# Patient Record
Sex: Male | Born: 1960
Health system: Southern US, Community
[De-identification: ages and names within clinical notes are randomized; demographics above are authoritative.]

## PROBLEM LIST (undated history)

## (undated) DIAGNOSIS — E119 Type 2 diabetes mellitus without complications: Secondary | ICD-10-CM

## (undated) DIAGNOSIS — I10 Essential (primary) hypertension: Secondary | ICD-10-CM

## (undated) DIAGNOSIS — S62619A Displaced fracture of proximal phalanx of unspecified finger, initial encounter for closed fracture: Secondary | ICD-10-CM

## (undated) HISTORY — PX: NO PAST SURGERIES: SHX2092

---

## 2006-09-13 ENCOUNTER — Emergency Department (HOSPITAL_COMMUNITY): Admission: EM | Admit: 2006-09-13 | Discharge: 2006-09-13 | Payer: Self-pay | Admitting: Emergency Medicine

## 2006-11-05 ENCOUNTER — Emergency Department (HOSPITAL_COMMUNITY): Admission: EM | Admit: 2006-11-05 | Discharge: 2006-11-05 | Payer: Self-pay | Admitting: Emergency Medicine

## 2007-03-22 ENCOUNTER — Ambulatory Visit (HOSPITAL_COMMUNITY): Admission: RE | Admit: 2007-03-22 | Discharge: 2007-03-22 | Payer: Self-pay | Admitting: General Surgery

## 2008-06-20 ENCOUNTER — Ambulatory Visit (HOSPITAL_COMMUNITY): Admission: RE | Admit: 2008-06-20 | Discharge: 2008-06-20 | Payer: Self-pay | Admitting: Preventative Medicine

## 2010-11-17 ENCOUNTER — Ambulatory Visit (HOSPITAL_COMMUNITY)
Admission: RE | Admit: 2010-11-17 | Discharge: 2010-11-17 | Payer: Self-pay | Source: Home / Self Care | Attending: Family Medicine | Admitting: Family Medicine

## 2011-03-27 NOTE — H&P (Signed)
Kristopher Fox, Kristopher Fox            ACCOUNT NO.:  [id number]   MEDICAL RECORD NO.:  [id number]          PATIENT TYPE:  AMB   LOCATION:  DAY                           FACILITY:  [facility]   PHYSICIAN:  Dalia Heading, M.D.  DATE OF BIRTH:  [birth year]   DATE OF ADMISSION:  DATE OF DISCHARGE:  LH                              HISTORY & PHYSICAL   CHIEF COMPLAINT:  Family history of colon carcinoma.   HISTORY OF PRESENT ILLNESS:  The patient is a 50 year old, black male  who is referred for endoscopic evaluation.  He needs a colonoscopy due  to a family history of colon carcinoma in his brother and father.  No  abdominal pain, weight loss, nausea, vomiting, diarrhea, constipation,  melena, or hematochezia have been noted.  He has never had a  colonoscopy.   PAST MEDICAL HISTORY:  Hypertension.   PAST SURGICAL HISTORY:  Unremarkable.   CURRENT MEDICATIONS:  Fluid pill.   ALLERGIES:  No known drug allergies.   REVIEW OF SYSTEMS:  Noncontributory.   PHYSICAL EXAMINATION:  GENERAL:  The patient is a well-developed, well-  nourished, black male in no acute distress.  LUNGS:  Clear to auscultation with equal breath sounds bilaterally.  HEART:  Regular rate and rhythm without S3, S4, murmurs.  ABDOMEN:  Soft, nontender, nondistended.  No hepatosplenomegaly or  masses are noted.  RECTAL:  Deferred to the procedure.   IMPRESSION:  Family history of colon carcinoma.   PLAN:  The patient is scheduled for a colonoscopy on February 23, 2007.  The risks and benefits of the procedure including bleeding and  perforation were fully explained to the patient, and gave informed  consent.      Dalia Heading, M.D.  Electronically Signed     MAJ/MEDQ  D:  02/10/2007  T:  02/10/2007  Job:  [id number]   cc:   Patrica Duel, M.D.  Fax: [phone]

## 2013-12-19 ENCOUNTER — Ambulatory Visit (HOSPITAL_COMMUNITY)
Admission: RE | Admit: 2013-12-19 | Discharge: 2013-12-19 | Disposition: A | Discharge status: Home / Self Care | Payer: PRIVATE HEALTH INSURANCE | Source: Ambulatory Visit | Attending: Internal Medicine | Admitting: Internal Medicine

## 2013-12-19 ENCOUNTER — Other Ambulatory Visit (HOSPITAL_COMMUNITY): Payer: Self-pay | Admitting: Internal Medicine

## 2013-12-19 DIAGNOSIS — M259 Joint disorder, unspecified: Secondary | ICD-10-CM | POA: Insufficient documentation

## 2013-12-19 DIAGNOSIS — M2390 Unspecified internal derangement of unspecified knee: Secondary | ICD-10-CM

## 2013-12-19 DIAGNOSIS — M25569 Pain in unspecified knee: Secondary | ICD-10-CM | POA: Insufficient documentation

## 2013-12-19 DIAGNOSIS — M25469 Effusion, unspecified knee: Secondary | ICD-10-CM | POA: Insufficient documentation

## 2014-01-02 ENCOUNTER — Ambulatory Visit: Payer: PRIVATE HEALTH INSURANCE | Admitting: Orthopedic Surgery

## 2014-01-15 ENCOUNTER — Ambulatory Visit (INDEPENDENT_AMBULATORY_CARE_PROVIDER_SITE_OTHER): Payer: PRIVATE HEALTH INSURANCE | Admitting: Orthopedic Surgery

## 2014-01-15 VITALS — BP 152/93 | Ht 68.0 in | Wt 250.0 lb

## 2014-01-15 DIAGNOSIS — M25469 Effusion, unspecified knee: Secondary | ICD-10-CM

## 2014-01-15 MED ORDER — NABUMETONE 500 MG PO TABS: 500.0000 mg | ORAL_TABLET | Freq: Two times a day (BID) | ORAL | Status: DC

## 2014-01-15 NOTE — Progress Notes (Signed)
Patient ID: Kristopher LessGregory S Fox, male   DOB: 1961/07/05, 53 y.o.   MRN: [id number] Chief Complaint  Patient presents with  . Knee Pain    Right knee pain and instability. Referred by Dr. Sherwood GamblerFusco    53 year old male with intermittent symptoms of pain and swelling in his right knee which seemed to have responded well to an intramuscular injection of cortisone. He presents with a three-week history of sudden onset of pain in the front of the knee which is exacerbated by walking. Occasional catching sensation with swelling. Currently asymptomatic. Review of systems negative. History of hypertension of surgery currently on losartan and hydrochlorothiazide combined pill  Family history of cancer diabetes  Social history married  Employment. Educational level XII. No smoking or drinking Vital signs BP 152/93  Ht 5\' 8"  (1.727 m)  Wt 250 lb (113.399 kg)  BMI 38.02 kg/m2  General appearance: Development, nutrition are normal. Body habitus moderately large No gross deformities are noted and grooming normal.  Peripheral vascular system no swelling or varicose veins are noted and pulses are palpable without tenderness, temperature warm to touch no edema.  No palpable lymph nodes are noted in the cervical area or axillae.  Gait abnormalities none detected Balance is normal  Neurologic exam shows no pathologic reflexes and overall normal sensation  Right knee evaluation Inspection reveals medium-sized joint effusion. Slight decrease in range of motion. Ligaments are stable. Motor exam quadricep strength grade 5. Skin warm dry and intact. Provocative meniscal tests negative. No joint line tenderness.   Left knee evaluation Inspection reveals no swelling or tenderness. Full range of motion is noted. The knee is stable and the anterior posterior plane as well as coronal plane. Motor exam reveals 5 out of 5 strength in the quadriceps and hand strength. The skin is warm dry and intact  Provocative  meniscal tests are normal  Radiographs show no acute abnormality  Impression probable acute synovitis with effusion  Patient currently asymptomatic responded well to IM shot recommend oral anti-inflammatories one to 2 weeks when knee is symptomatic and if symptoms fail to improve call the office to get a new appointment for reevaluation.

## 2014-01-15 NOTE — Patient Instructions (Signed)
Take medication IF knee hurts

## 2018-03-16 ENCOUNTER — Emergency Department (HOSPITAL_COMMUNITY): Payer: BLUE CROSS/BLUE SHIELD

## 2018-03-16 ENCOUNTER — Encounter (HOSPITAL_COMMUNITY): Payer: Self-pay | Admitting: Emergency Medicine

## 2018-03-16 ENCOUNTER — Other Ambulatory Visit: Payer: Self-pay

## 2018-03-16 ENCOUNTER — Emergency Department (HOSPITAL_COMMUNITY)
Admission: EM | Admit: 2018-03-16 | Discharge: 2018-03-16 | Disposition: A | Discharge status: Home / Self Care | Payer: BLUE CROSS/BLUE SHIELD | Attending: Emergency Medicine | Admitting: Emergency Medicine

## 2018-03-16 DIAGNOSIS — Y939 Activity, unspecified: Secondary | ICD-10-CM | POA: Insufficient documentation

## 2018-03-16 DIAGNOSIS — E119 Type 2 diabetes mellitus without complications: Secondary | ICD-10-CM | POA: Insufficient documentation

## 2018-03-16 DIAGNOSIS — X58XXXA Exposure to other specified factors, initial encounter: Secondary | ICD-10-CM | POA: Diagnosis not present

## 2018-03-16 DIAGNOSIS — Y929 Unspecified place or not applicable: Secondary | ICD-10-CM | POA: Insufficient documentation

## 2018-03-16 DIAGNOSIS — Y999 Unspecified external cause status: Secondary | ICD-10-CM | POA: Diagnosis not present

## 2018-03-16 DIAGNOSIS — S39012A Strain of muscle, fascia and tendon of lower back, initial encounter: Secondary | ICD-10-CM

## 2018-03-16 DIAGNOSIS — S3992XA Unspecified injury of lower back, initial encounter: Secondary | ICD-10-CM | POA: Diagnosis present

## 2018-03-16 DIAGNOSIS — I10 Essential (primary) hypertension: Secondary | ICD-10-CM | POA: Diagnosis not present

## 2018-03-16 HISTORY — DX: Essential (primary) hypertension: I10

## 2018-03-16 HISTORY — DX: Type 2 diabetes mellitus without complications: E11.9

## 2018-03-16 LAB — URINALYSIS, ROUTINE W REFLEX MICROSCOPIC
Bilirubin Urine: NEGATIVE
Glucose, UA: NEGATIVE mg/dL
Hgb urine dipstick: NEGATIVE
KETONES UR: NEGATIVE mg/dL
LEUKOCYTES UA: NEGATIVE
NITRITE: NEGATIVE
PH: 5 (ref 5.0–8.0)
Protein, ur: NEGATIVE mg/dL
SPECIFIC GRAVITY, URINE: 1.026 (ref 1.005–1.030)

## 2018-03-16 MED ORDER — METHOCARBAMOL 500 MG PO TABS
1000.0000 mg | ORAL_TABLET | Freq: Once | ORAL | Status: AC
  Administered 2018-03-16: 1000 mg via ORAL
  Filled 2018-03-16: qty 2

## 2018-03-16 MED ORDER — IBUPROFEN 800 MG PO TABS: 800.0000 mg | ORAL_TABLET | Freq: Three times a day (TID) | ORAL | 0 refills | Status: AC

## 2018-03-16 MED ORDER — IBUPROFEN 800 MG PO TABS
800.0000 mg | ORAL_TABLET | Freq: Once | ORAL | Status: AC
  Administered 2018-03-16: 800 mg via ORAL
  Filled 2018-03-16: qty 1

## 2018-03-16 MED ORDER — METHOCARBAMOL 500 MG PO TABS: 1000.0000 mg | ORAL_TABLET | Freq: Four times a day (QID) | ORAL | 0 refills | Status: AC

## 2018-03-16 MED ORDER — HYDROCODONE-ACETAMINOPHEN 5-325 MG PO TABS
1.0000 | ORAL_TABLET | Freq: Once | ORAL | Status: AC
  Administered 2018-03-16: 1 via ORAL
  Filled 2018-03-16: qty 1

## 2018-03-16 MED ORDER — HYDROCODONE-ACETAMINOPHEN 5-325 MG PO TABS: 1.0000 | ORAL_TABLET | ORAL | 0 refills | Status: DC | PRN

## 2018-03-16 NOTE — ED Triage Notes (Signed)
Patient c/o non-radiating, right lower back pain. Per patient no known injury, but lifting at work. Patient reports taking BC powder and Back Max pill with no relief. CNS intact, patient ambulatory. Denies any complications with BMs or urination/urinary symptoms.

## 2018-03-16 NOTE — Discharge Instructions (Addendum)
T Do not drive within 4 hours of taking hydrocodone or robaxin as this will make you drowsy.  Avoid lifting,  Bending,  Twisting or any other activity that worsens your pain over the next week.  Apply a heating pad to your lower back for 20 minutes several times daily.   You should get rechecked if your symptoms are not better over the next 5 days,  Or you develop increased pain,  Weakness in your leg(s) or loss of bladder or bowel function - these are symptoms of a worsening condition.

## 2018-03-16 NOTE — ED Notes (Signed)
Pt states he has had previous kidney stones. When asked if this feels the same patient doesn't give an accurate answer. States "it just hurts real bad." Lifting at work and at home when pain started.

## 2018-03-16 NOTE — ED Provider Notes (Signed)
[facility] EMERGENCY DEPARTMENT Provider Note   CSN: [id number] Arrival date & time: 03/16/18  2130     History   Chief Complaint Chief Complaint  Patient presents with  . Back Pain    HPI Kristopher Fox is a 57 y.o. male with a history of occasional low back pain with development of right lower back pain for the past week, not relieved by otc medications including BC owders and a medication called Back max.   Patient denies any new injury specifically but has been lifting heavy boxes at work.  There is not  radiation of pain into his legs and he has been no weakness or numbness in the lower extremities and no urinary or bowel retention or incontinence.  Patient does not have a history of cancer or IVDU.  He reports a distant hx of kidney stones but denies flank pain or urinary sx and sx are triggered by movement.  The history is provided by the patient.    Past Medical History:  Diagnosis Date  . Diabetes mellitus without complication (HCC)   . Hypertension     Patient Active Problem List   Diagnosis Date Noted  . Effusion of knee joint 01/15/2014    History reviewed. No pertinent surgical history.      Home Medications    Prior to Admission medications   Medication Sig Start Date End Date Taking? Authorizing Provider  HYDROcodone-acetaminophen (NORCO/VICODIN) 5-325 MG tablet Take 1 tablet by mouth every 4 (four) hours as needed. 03/16/18   Burgess Amor, PA-C  ibuprofen (ADVIL,MOTRIN) 800 MG tablet Take 1 tablet (800 mg total) by mouth 3 (three) times daily. 03/16/18   Burgess Amor, PA-C  methocarbamol (ROBAXIN) 500 MG tablet Take 2 tablets (1,000 mg total) by mouth 4 (four) times daily for 5 days. 03/16/18 03/21/18  Burgess Amor, PA-C    Family History Family History  Problem Relation Age of Onset  . Cancer Other   . Diabetes Other     Social History Social History   Tobacco Use  . Smoking status: Never Smoker  . Smokeless tobacco: Never Used  Substance Use  Topics  . Alcohol use: Never    Frequency: Never  . Drug use: Never     Allergies   Patient has no known allergies.   Review of Systems Review of Systems  Constitutional: Negative for fever.  Respiratory: Negative for shortness of breath.   Cardiovascular: Negative for chest pain and leg swelling.  Gastrointestinal: Negative for abdominal distention, abdominal pain and constipation.  Genitourinary: Negative for difficulty urinating, dysuria, flank pain, frequency and urgency.  Musculoskeletal: Positive for back pain. Negative for gait problem and joint swelling.  Skin: Negative for rash.  Neurological: Negative for weakness and numbness.     Physical Exam Updated Vital Signs BP 138/84 (BP Location: Right Arm)   Pulse 62   Temp 98.8 F (37.1 C) (Oral)   Resp 18   Ht  (1.778 m)   Wt 108.9 kg (240 lb)   SpO2 97%   BMI 34.44 kg/m   Physical Exam  Constitutional: He appears well-developed and well-nourished.  HENT:  Head: Normocephalic.  Eyes: Conjunctivae are normal.  Neck: Normal range of motion. Neck supple.  Cardiovascular: Normal rate and intact distal pulses.  Pedal pulses normal.  Pulmonary/Chest: Effort normal.  Abdominal: Soft. Bowel sounds are normal. He exhibits no distension and no mass.  Musculoskeletal: Normal range of motion. He exhibits no edema.  Lumbar back: He exhibits tenderness. He exhibits no swelling, no edema and no spasm.  Neurological: He is alert. He has normal strength. He displays no atrophy and no tremor. No sensory deficit. Gait normal.  Reflex Scores:      Patellar reflexes are 2+ on the right side and 2+ on the left side. No strength deficit noted in hip and knee flexor and extensor muscle groups.  Ankle flexion and extension intact.  SLR right.   Skin: Skin is warm and dry.  Psychiatric: He has a normal mood and affect.  Nursing note and vitals reviewed.    ED Treatments / Results  Labs (all labs ordered are listed,  but only abnormal results are displayed) Labs Reviewed  URINALYSIS, ROUTINE W REFLEX MICROSCOPIC    EKG None  Radiology Dg Lumbar Spine Complete  Result Date: 03/16/2018 CLINICAL DATA:  Low back pain radiating into the right leg EXAM: LUMBAR SPINE - COMPLETE 4+ VIEW COMPARISON:  11/05/2006 FINDINGS: Normal alignment. No acute osseous finding or fracture. Lumbar spondylosis noted at L2-3 and L3-4 with disc space narrowing, sclerosis and endplate osteophytes. Facets are aligned. No pars defects. Normal appearing pedicles and SI joints. Normal gas pattern. IMPRESSION: Slight progression of lumbar spondylosis at L2-3 and L3-4 as above. No acute finding by plain radiography. Electronically Signed   By: Judie Petit.  Shick M.D.   On: 03/16/2018 10:59    Procedures Procedures (including critical care time)  Medications Ordered in ED Medications  ibuprofen (ADVIL,MOTRIN) tablet 800 mg (800 mg Oral Given 03/16/18 1021)  methocarbamol (ROBAXIN) tablet 1,000 mg (1,000 mg Oral Given 03/16/18 1021)  HYDROcodone-acetaminophen (NORCO/VICODIN) 5-325 MG per tablet 1 tablet (1 tablet Oral Given 03/16/18 1021)     Initial Impression / Assessment and Plan / ED Course  I have reviewed the triage vital signs and the nursing notes.  Pertinent labs & imaging results that were available during my care of the patient were reviewed by me and considered in my medical decision making (see chart for details).     No neuro deficit on exam or by history to suggest emergent or surgical presentation.  Discussed worsened sx that should prompt immediate re-evaluation including distal weakness, bowel/bladder retention/incontinence. Urine negative for hematuria and pt has no flank pain, exam c/w musculoskeletal source as is reproducible with palpation and movement. He was prescribed hydrocodone, robaxin,  Ibuprofen. Plan f/u with pcp for new or persistent sx.        Final Clinical Impressions(s) / ED Diagnoses   Final diagnoses:   Strain of lumbar region, initial encounter    ED Discharge Orders        Ordered    HYDROcodone-acetaminophen (NORCO/VICODIN) 5-325 MG tablet  Every 4 hours PRN     03/16/18 1110    ibuprofen (ADVIL,MOTRIN) 800 MG tablet  3 times daily     03/16/18 1110    methocarbamol (ROBAXIN) 500 MG tablet  4 times daily     03/16/18 1110       Burgess Amor, PA-C 03/16/18 1112    Bethann Berkshire, MD 03/16/18 1504

## 2018-07-05 DIAGNOSIS — I10 Essential (primary) hypertension: Secondary | ICD-10-CM | POA: Diagnosis not present

## 2018-07-05 DIAGNOSIS — E1159 Type 2 diabetes mellitus with other circulatory complications: Secondary | ICD-10-CM | POA: Diagnosis not present

## 2019-02-24 ENCOUNTER — Other Ambulatory Visit: Payer: Self-pay

## 2019-02-24 ENCOUNTER — Encounter (HOSPITAL_BASED_OUTPATIENT_CLINIC_OR_DEPARTMENT_OTHER)
Admission: RE | Admit: 2019-02-24 | Discharge: 2019-02-24 | Disposition: A | Discharge status: Home / Self Care | Payer: Managed Care, Other (non HMO) | Source: Ambulatory Visit | Attending: Orthopaedic Surgery | Admitting: Orthopaedic Surgery

## 2019-02-24 ENCOUNTER — Encounter (HOSPITAL_BASED_OUTPATIENT_CLINIC_OR_DEPARTMENT_OTHER): Payer: Self-pay | Admitting: *Deleted

## 2019-02-24 DIAGNOSIS — Z01810 Encounter for preprocedural cardiovascular examination: Secondary | ICD-10-CM | POA: Insufficient documentation

## 2019-02-24 DIAGNOSIS — W3189XA Contact with other specified machinery, initial encounter: Secondary | ICD-10-CM | POA: Diagnosis not present

## 2019-02-24 DIAGNOSIS — S65011A Laceration of ulnar artery at wrist and hand level of right arm, initial encounter: Secondary | ICD-10-CM | POA: Diagnosis not present

## 2019-02-24 DIAGNOSIS — I10 Essential (primary) hypertension: Secondary | ICD-10-CM | POA: Diagnosis not present

## 2019-02-24 DIAGNOSIS — S62511A Displaced fracture of proximal phalanx of right thumb, initial encounter for closed fracture: Secondary | ICD-10-CM

## 2019-02-24 DIAGNOSIS — E119 Type 2 diabetes mellitus without complications: Secondary | ICD-10-CM | POA: Diagnosis not present

## 2019-02-24 DIAGNOSIS — S62511B Displaced fracture of proximal phalanx of right thumb, initial encounter for open fracture: Secondary | ICD-10-CM | POA: Diagnosis not present

## 2019-02-24 LAB — BASIC METABOLIC PANEL
Anion gap: 11 (ref 5–15)
BUN: 14 mg/dL (ref 6–20)
CO2: 24 mmol/L (ref 22–32)
Calcium: 9.2 mg/dL (ref 8.9–10.3)
Chloride: 106 mmol/L (ref 98–111)
Creatinine, Ser: 0.96 mg/dL (ref 0.61–1.24)
GFR calc Af Amer: >60 mL/min (ref >60)
GFR calc non Af Amer: >60 mL/min (ref >60)
Glucose, Bld: 122 mg/dL — ABNORMAL HIGH (ref 70–99)
Potassium: 3.9 mmol/L (ref 3.5–5.1)
Sodium: 141 mmol/L (ref 135–145)

## 2019-02-27 ENCOUNTER — Ambulatory Visit (HOSPITAL_BASED_OUTPATIENT_CLINIC_OR_DEPARTMENT_OTHER): Payer: Managed Care, Other (non HMO) | Admitting: Anesthesiology

## 2019-02-27 ENCOUNTER — Encounter (HOSPITAL_BASED_OUTPATIENT_CLINIC_OR_DEPARTMENT_OTHER)
Admission: RE | Disposition: A | Discharge status: Home / Self Care | Payer: Self-pay | Source: Home / Self Care | Attending: Orthopaedic Surgery

## 2019-02-27 ENCOUNTER — Encounter (HOSPITAL_BASED_OUTPATIENT_CLINIC_OR_DEPARTMENT_OTHER): Payer: Self-pay

## 2019-02-27 ENCOUNTER — Ambulatory Visit (HOSPITAL_BASED_OUTPATIENT_CLINIC_OR_DEPARTMENT_OTHER)
Admission: RE | Admit: 2019-02-27 | Discharge: 2019-02-27 | Disposition: A | Discharge status: Home / Self Care | Payer: Managed Care, Other (non HMO) | Attending: Orthopaedic Surgery | Admitting: Orthopaedic Surgery

## 2019-02-27 ENCOUNTER — Other Ambulatory Visit: Payer: Self-pay

## 2019-02-27 ENCOUNTER — Other Ambulatory Visit (HOSPITAL_BASED_OUTPATIENT_CLINIC_OR_DEPARTMENT_OTHER): Payer: BLUE CROSS/BLUE SHIELD

## 2019-02-27 DIAGNOSIS — E119 Type 2 diabetes mellitus without complications: Secondary | ICD-10-CM | POA: Insufficient documentation

## 2019-02-27 DIAGNOSIS — S62511B Displaced fracture of proximal phalanx of right thumb, initial encounter for open fracture: Secondary | ICD-10-CM | POA: Insufficient documentation

## 2019-02-27 DIAGNOSIS — W3189XA Contact with other specified machinery, initial encounter: Secondary | ICD-10-CM | POA: Insufficient documentation

## 2019-02-27 DIAGNOSIS — S65011A Laceration of ulnar artery at wrist and hand level of right arm, initial encounter: Secondary | ICD-10-CM | POA: Insufficient documentation

## 2019-02-27 DIAGNOSIS — I10 Essential (primary) hypertension: Secondary | ICD-10-CM | POA: Insufficient documentation

## 2019-02-27 HISTORY — DX: Displaced fracture of proximal phalanx of unspecified finger, initial encounter for closed fracture: S62.619A

## 2019-02-27 HISTORY — PX: CLOSED REDUCTION FINGER WITH PERCUTANEOUS PINNING: SHX5612

## 2019-02-27 HISTORY — PX: I&D EXTREMITY: SHX5045

## 2019-02-27 LAB — GLUCOSE, CAPILLARY
Glucose-Capillary: 101 mg/dL — ABNORMAL HIGH (ref 70–99)
Glucose-Capillary: 117 mg/dL — ABNORMAL HIGH (ref 70–99)

## 2019-02-27 SURGERY — CLOSED REDUCTION, FINGER, WITH PERCUTANEOUS PINNING
Anesthesia: General | Site: Thumb | Laterality: Right

## 2019-02-27 MED ORDER — CEFAZOLIN SODIUM-DEXTROSE 2-4 GM/100ML-% IV SOLN
2.0000 g | INTRAVENOUS | Status: AC
  Administered 2019-02-27: 2 g via INTRAVENOUS

## 2019-02-27 MED ORDER — EPHEDRINE SULFATE 50 MG/ML IJ SOLN
INTRAMUSCULAR | Status: DC | PRN
  Administered 2019-02-27: 10 mg via INTRAVENOUS

## 2019-02-27 MED ORDER — PROPOFOL 10 MG/ML IV BOLUS
INTRAVENOUS | Status: DC | PRN
  Administered 2019-02-27: 200 mg via INTRAVENOUS

## 2019-02-27 MED ORDER — BUPIVACAINE HCL (PF) 0.25 % IJ SOLN
INTRAMUSCULAR | Status: DC | PRN
  Administered 2019-02-27: 10 mL

## 2019-02-27 MED ORDER — LIDOCAINE HCL (CARDIAC) PF 100 MG/5ML IV SOSY
PREFILLED_SYRINGE | INTRAVENOUS | Status: DC | PRN
  Administered 2019-02-27: 100 mg via INTRAVENOUS

## 2019-02-27 MED ORDER — MIDAZOLAM HCL 2 MG/2ML IJ SOLN
INTRAMUSCULAR | Status: AC
  Filled 2019-02-27: qty 2

## 2019-02-27 MED ORDER — EPHEDRINE 5 MG/ML INJ
INTRAVENOUS | Status: AC
  Filled 2019-02-27: qty 10

## 2019-02-27 MED ORDER — MIDAZOLAM HCL 2 MG/2ML IJ SOLN
1.0000 mg | INTRAMUSCULAR | Status: DC | PRN
  Administered 2019-02-27: 2 mg via INTRAVENOUS

## 2019-02-27 MED ORDER — FENTANYL CITRATE (PF) 100 MCG/2ML IJ SOLN
INTRAMUSCULAR | Status: AC
  Filled 2019-02-27: qty 2

## 2019-02-27 MED ORDER — ONDANSETRON HCL 4 MG/2ML IJ SOLN
INTRAMUSCULAR | Status: AC
  Filled 2019-02-27: qty 2

## 2019-02-27 MED ORDER — OXYCODONE HCL 5 MG/5ML PO SOLN: 5.0000 mg | Freq: Once | ORAL | Status: DC | PRN

## 2019-02-27 MED ORDER — KETOROLAC TROMETHAMINE 30 MG/ML IJ SOLN: 30.0000 mg | Freq: Once | INTRAMUSCULAR | Status: DC | PRN

## 2019-02-27 MED ORDER — CEFAZOLIN SODIUM-DEXTROSE 2-4 GM/100ML-% IV SOLN
INTRAVENOUS | Status: AC
  Filled 2019-02-27: qty 100

## 2019-02-27 MED ORDER — HYDROCODONE-ACETAMINOPHEN 5-325 MG PO TABS: 1.0000 | ORAL_TABLET | ORAL | 0 refills | Status: AC | PRN

## 2019-02-27 MED ORDER — CHLORHEXIDINE GLUCONATE 4 % EX LIQD: 60.0000 mL | Freq: Once | CUTANEOUS | Status: DC

## 2019-02-27 MED ORDER — FENTANYL CITRATE (PF) 100 MCG/2ML IJ SOLN
50.0000 ug | INTRAMUSCULAR | Status: DC | PRN
  Administered 2019-02-27: 50 ug via INTRAVENOUS
  Administered 2019-02-27: 25 ug via INTRAVENOUS

## 2019-02-27 MED ORDER — LACTATED RINGERS IV SOLN
INTRAVENOUS | Status: DC
  Administered 2019-02-27 (×2): via INTRAVENOUS

## 2019-02-27 MED ORDER — DEXAMETHASONE SODIUM PHOSPHATE 10 MG/ML IJ SOLN
INTRAMUSCULAR | Status: AC
  Filled 2019-02-27: qty 1

## 2019-02-27 MED ORDER — SCOPOLAMINE 1 MG/3DAYS TD PT72: 1.0000 | MEDICATED_PATCH | Freq: Once | TRANSDERMAL | Status: DC | PRN

## 2019-02-27 MED ORDER — OXYCODONE HCL 5 MG PO TABS: 5.0000 mg | ORAL_TABLET | Freq: Once | ORAL | Status: DC | PRN

## 2019-02-27 MED ORDER — PROMETHAZINE HCL 25 MG/ML IJ SOLN: 6.2500 mg | INTRAMUSCULAR | Status: DC | PRN

## 2019-02-27 MED ORDER — FENTANYL CITRATE (PF) 100 MCG/2ML IJ SOLN
25.0000 ug | INTRAMUSCULAR | Status: DC | PRN
  Administered 2019-02-27: 50 ug via INTRAVENOUS

## 2019-02-27 SURGICAL SUPPLY — 70 items
BANDAGE ACE 3X5.8 VEL STRL LF (GAUZE/BANDAGES/DRESSINGS) ×4 IMPLANT
BANDAGE ACE 4X5 VEL STRL LF (GAUZE/BANDAGES/DRESSINGS) ×4 IMPLANT
BENZOIN TINCTURE PRP APPL 2/3 (GAUZE/BANDAGES/DRESSINGS) IMPLANT
BLADE CLIPPER SURG (BLADE) IMPLANT
BLADE SURG 15 STRL LF DISP TIS (BLADE) ×4 IMPLANT
BLADE SURG 15 STRL SS (BLADE) ×4
BNDG CONFORM 2 STRL LF (GAUZE/BANDAGES/DRESSINGS) ×4 IMPLANT
BNDG ESMARK 4X9 LF (GAUZE/BANDAGES/DRESSINGS) ×4 IMPLANT
BNDG GAUZE ELAST 4 BULKY (GAUZE/BANDAGES/DRESSINGS) IMPLANT
BRUSH SCRUB EZ PLAIN DRY (MISCELLANEOUS) ×4 IMPLANT
CANISTER SUCT 1200ML W/VALVE (MISCELLANEOUS) ×4 IMPLANT
CAP PIN PROTECTOR ORTHO WHT (CAP) ×4 IMPLANT
CLOSURE WOUND 1/2 X4 (GAUZE/BANDAGES/DRESSINGS)
CORD BIPOLAR FORCEPS 12FT (ELECTRODE) ×4 IMPLANT
COVER BACK TABLE REUSABLE LG (DRAPES) ×4 IMPLANT
COVER WAND RF STERILE (DRAPES) IMPLANT
CUFF TOURN SGL QUICK 18X4 (TOURNIQUET CUFF) ×4 IMPLANT
DECANTER SPIKE VIAL GLASS SM (MISCELLANEOUS) IMPLANT
DRAPE EXTREMITY T 121X128X90 (DISPOSABLE) ×4 IMPLANT
DRAPE OEC MINIVIEW 54X84 (DRAPES) ×4 IMPLANT
DRAPE SURG 17X23 STRL (DRAPES) ×4 IMPLANT
DRSG EMULSION OIL 3X3 NADH (GAUZE/BANDAGES/DRESSINGS) IMPLANT
GAUZE 4X4 16PLY RFD (DISPOSABLE) IMPLANT
GAUZE SPONGE 4X4 12PLY STRL (GAUZE/BANDAGES/DRESSINGS) ×4 IMPLANT
GAUZE XEROFORM 1X8 LF (GAUZE/BANDAGES/DRESSINGS) ×4 IMPLANT
GAUZE XEROFORM 5X9 LF (GAUZE/BANDAGES/DRESSINGS) IMPLANT
GLOVE BIOGEL PI IND STRL 8 (GLOVE) ×4 IMPLANT
GLOVE BIOGEL PI INDICATOR 8 (GLOVE) ×4
GLOVE SURG SYN 7.5  E (GLOVE) ×4
GLOVE SURG SYN 7.5 E (GLOVE) ×4 IMPLANT
GOWN STRL REUS W/ TWL LRG LVL3 (GOWN DISPOSABLE) ×2 IMPLANT
GOWN STRL REUS W/ TWL XL LVL3 (GOWN DISPOSABLE) ×2 IMPLANT
GOWN STRL REUS W/TWL LRG LVL3 (GOWN DISPOSABLE) ×2
GOWN STRL REUS W/TWL XL LVL3 (GOWN DISPOSABLE) ×2
K-WIRE .045X4 (WIRE) ×8 IMPLANT
NEEDLE HYPO 22GX1.5 SAFETY (NEEDLE) IMPLANT
NEEDLE HYPO 25X1 1.5 SAFETY (NEEDLE) ×4 IMPLANT
NS IRRIG 1000ML POUR BTL (IV SOLUTION) ×4 IMPLANT
PACK BASIN DAY SURGERY FS (CUSTOM PROCEDURE TRAY) ×4 IMPLANT
PAD CAST 3X4 CTTN HI CHSV (CAST SUPPLIES) ×2 IMPLANT
PAD CAST 4YDX4 CTTN HI CHSV (CAST SUPPLIES) IMPLANT
PADDING CAST ABS 3INX4YD NS (CAST SUPPLIES)
PADDING CAST ABS COTTON 3X4 (CAST SUPPLIES) IMPLANT
PADDING CAST COTTON 3X4 STRL (CAST SUPPLIES) ×2
PADDING CAST COTTON 4X4 STRL (CAST SUPPLIES)
SHEET MEDIUM DRAPE 40X70 STRL (DRAPES) ×4 IMPLANT
SPLINT FIBERGLASS 3X35 (CAST SUPPLIES) ×4 IMPLANT
SPLINT PLASTER CAST XFAST 3X15 (CAST SUPPLIES) IMPLANT
SPLINT PLASTER XTRA FASTSET 3X (CAST SUPPLIES)
STOCKINETTE 4X48 STRL (DRAPES) ×4 IMPLANT
STOCKINETTE SYNTHETIC 3 UNSTER (CAST SUPPLIES) IMPLANT
STRIP CLOSURE SKIN 1/2X4 (GAUZE/BANDAGES/DRESSINGS) IMPLANT
SUCTION FRAZIER HANDLE 10FR (MISCELLANEOUS) ×2
SUCTION TUBE FRAZIER 10FR DISP (MISCELLANEOUS) ×2 IMPLANT
SUT ETHIBOND 3-0 V-5 (SUTURE) IMPLANT
SUT ETHILON 4 0 PS 2 18 (SUTURE) IMPLANT
SUT ETHILON 5 0 PS 2 18 (SUTURE) IMPLANT
SUT PROLENE 4 0 PS 2 18 (SUTURE) ×8 IMPLANT
SUT VIC AB 3-0 FS2 27 (SUTURE) IMPLANT
SUT VIC AB 4-0 P-3 18XBRD (SUTURE) IMPLANT
SUT VIC AB 4-0 P3 18 (SUTURE)
SUT VICRYL 4-0 PS2 18IN ABS (SUTURE) IMPLANT
SYR BULB 3OZ (MISCELLANEOUS) ×4 IMPLANT
SYR CONTROL 10ML LL (SYRINGE) ×4 IMPLANT
TAPE SURG TRANSPORE 1 IN (GAUZE/BANDAGES/DRESSINGS) ×2 IMPLANT
TAPE SURGICAL TRANSPORE 1 IN (GAUZE/BANDAGES/DRESSINGS) ×2
TOWEL GREEN STERILE FF (TOWEL DISPOSABLE) ×8 IMPLANT
TUBE CONNECTING 20'X1/4 (TUBING)
TUBE CONNECTING 20X1/4 (TUBING) IMPLANT
UNDERPAD 30X30 (UNDERPADS AND DIAPERS) ×4 IMPLANT

## 2019-02-27 NOTE — Anesthesia Preprocedure Evaluation (Signed)
Anesthesia Evaluation  Patient identified by MRN, date of birth, ID band Patient awake    Reviewed: Allergy & Precautions, NPO status , Patient's Chart, lab work & pertinent test results  Airway Mallampati: II  TM Distance: >3 FB Neck ROM: Full    Dental no notable dental hx.    Pulmonary neg pulmonary ROS,    Pulmonary exam normal breath sounds clear to auscultation       Cardiovascular hypertension, Pt. on medications Normal cardiovascular exam Rhythm:Regular Rate:Normal     Neuro/Psych negative neurological ROS  negative psych ROS   GI/Hepatic negative GI ROS, Neg liver ROS,   Endo/Other  diabetes  Renal/GU negative Renal ROS  negative genitourinary   Musculoskeletal negative musculoskeletal ROS (+)   Abdominal   Peds negative pediatric ROS (+)  Hematology negative hematology ROS (+)   Anesthesia Other Findings   Reproductive/Obstetrics negative OB ROS                             Anesthesia Physical Anesthesia Plan  ASA: II  Anesthesia Plan: General   Post-op Pain Management:    Induction: Intravenous  PONV Risk Score and Plan: 2 and Ondansetron and Treatment may vary due to age or medical condition  Airway Management Planned: LMA  Additional Equipment:   Intra-op Plan:   Post-operative Plan: Extubation in OR  Informed Consent: I have reviewed the patients History and Physical, chart, labs and discussed the procedure including the risks, benefits and alternatives for the proposed anesthesia with the patient or authorized representative who has indicated his/her understanding and acceptance.     Dental advisory given  Plan Discussed with: CRNA and Surgeon  Anesthesia Plan Comments:         Anesthesia Quick Evaluation

## 2019-02-27 NOTE — Anesthesia Postprocedure Evaluation (Signed)
Anesthesia Post Note  Patient: Kristopher Fox  Procedure(Fox) Performed: Right thumb proximal phalanx fracture reduction and surgical fixation with irrigation and debridement (Right Thumb)     Patient location during evaluation: PACU Anesthesia Type: General Level of consciousness: awake and alert Pain management: pain level controlled Vital Signs Assessment: post-procedure vital signs reviewed and stable Respiratory status: spontaneous breathing, nonlabored ventilation, respiratory function stable and patient connected to nasal cannula oxygen Cardiovascular status: blood pressure returned to baseline and stable Postop Assessment: no apparent nausea or vomiting Anesthetic complications: no    Last Vitals:  Vitals:   02/27/19 1132 02/27/19 1145  BP: (!) 145/63 (!) 149/77  Pulse: 74 69  Resp: 14 17  Temp: (!) 36.2 C   SpO2: 93% 99%    Last Pain:  Vitals:   02/27/19 1145  TempSrc:   PainSc: 7                  Kristopher Fox

## 2019-02-27 NOTE — Discharge Instructions (Signed)
°  Discharge Instructions  - Keep dressings in place. Do not remove them. - The dressings must stay dry - Take all medication as prescribed. Transition to over the counter pain medication as your pain improves - Keep the hand elevated over the next 48-72 hours to help with pain and swelling - Move all digits not restricted by the dressings regularly to prevent stiffness - Please call to schedule a follow up appointment with Dr. Roney Mans at [phone] for 10-14 days following surgery - Your pain medication have been sent digitally to your pharmacy - Complete your oral antibiotics as previously prescribed.      Post Anesthesia Home Care Instructions  Activity: Get plenty of rest for the remainder of the day. A responsible individual must stay with you for 24 hours following the procedure.  For the next 24 hours, DO NOT: -Drive a car -Advertising copywriter -Drink alcoholic beverages -Take any medication unless instructed by your physician -Make any legal decisions or sign important papers.  Meals: Start with liquid foods such as gelatin or soup. Progress to regular foods as tolerated. Avoid greasy, spicy, heavy foods. If nausea and/or vomiting occur, drink only clear liquids until the nausea and/or vomiting subsides. Call your physician if vomiting continues.  Special Instructions/Symptoms: Your throat may feel dry or sore from the anesthesia or the breathing tube placed in your throat during surgery. If this causes discomfort, gargle with warm salt water. The discomfort should disappear within 24 hours.  If you had a scopolamine patch placed behind your ear for the management of post- operative nausea and/or vomiting:  1. The medication in the patch is effective for 72 hours, after which it should be removed.  Wrap patch in a tissue and discard in the trash. Wash hands thoroughly with soap and water. 2. You may remove the patch earlier than 72 hours if you experience unpleasant side  effects which may include dry mouth, dizziness or visual disturbances. 3. Avoid touching the patch. Wash your hands with soap and water after contact with the patch.

## 2019-02-27 NOTE — Op Note (Signed)
PREOPERATIVE DIAGNOSIS: Right thumb proximal phalanx fracture with volar laceration  POSTOPERATIVE DIAGNOSIS: Same  ATTENDING PHYSICIAN: Gasper LloydJames J. Roney Mansreighton, III, MD who was present and scrubbed for the entire case   ASSISTANT SURGEON: None.   ANESTHESIA: General  SURGICAL PROCEDURES:  1. Percutaneous pin fixation of comminuted right thumb proximal phalanx fracture 2.  Irrigation debridement of skin and subcutaneous tissue to the right thumb volar wound 3.  Exploration of right thumb volar wound with neural lysis to the radial and ulnar digital nerves   SURGICAL INDICATIONS: Patient is a 58 year old male who was seen in my clinic last week.  He stained an injury to the right thumb while working on his lawnmower.  The thumb got pinched between the blade and a piece of metal while he was trying to remove it sustaining laceration on the volar aspect of thumb as well as a comminuted proximal phalanx fracture.  Preoperatively he had intact but decreased sensation to both radial and ulnar aspects of the thumb with paresthesias throughout.  I discussed treatment options with him and recommended proceeding forward with operative fixation of his proximal phalanx fracture as well as exploration of his wound to evaluate for digital nerve and potential flexor tendon injury.  FINDING: Comminuted right thumb proximal phalanx fracture with stable fixation following percutaneous pinning with 0.45 K wires x2.  Wound exploration showed intact flexor tendon as well as digital nerves both radially and ulnarly.  There was laceration of the ulnar digital artery but the thumb was well-perfused both preoperatively and postoperatively.  DESCRIPTION OF PROCEDURE: Patient was identified in the preop holding area where the risk benefits and alternatives of the procedure were discussed with the patient.  These include but not limited to infection, bleeding, damage to surrounding structures including blood vessels and nerves,  pain, stiffness, malunion, nonunion, need for additional procedures.  Informed consent was obtained at that time the patient's right hand was marked with surgical marking pen.  He was brought back to operative suite where a timeout was performed identifying the correct patient operative site.  He was positioned supine operative table with his hand outstretched on a hand table.  He was induced under general LMA anesthesia.  A tourniquet was placed on the upper arm and the arm was then prepped and draped in usual sterile fashion.  The limb was exsanguinated and the tourniquet was inflated.  Began with percutaneous pin fixation of his proximal phalanx fracture.  Fluoroscopic images were obtained which showed a well aligned yet comminuted proximal phalanx.  Because there was an open wound the decision was made to avoid internal hardware so temporary 0.045 K wires x2 were placed.  These were advanced antegrade through the proximal phalanx and into the subchondral bone distally.  Anatomic alignment of the fracture was achieved under fluoroscopic images.  The pins were bent and cut outside of the skin and pin caps were placed.  Attention was then turned to the volar wound.  There was a large transverse laceration overlying the proximal phalanx.  This was extended both proximally and distally along mid axial lines.  Flaps were raised showing the underlying flexor tendon and sheath.  Both the sheath and tendon deep were intact without evidence of laceration.  Then began dissecting out the radial digital nerve.  This was neurolysed bluntly using scissors both proximally and distally in the wound for complete visualization and was found to be fully intact.  The ulnar digital nerve was then found.  Similarly blunt dissection was  performed around the nerve performing a neural lysis which showed the nerve ulnarly was also intact.  Both the radial and ulnar digital nerves did show some bruising but there were no lacerations.   Ulnarly the digital artery was found to be lacerated but the decision was made to forego repair of this as the patient's thumb preoperatively was pink and well perfused.  At this point the tourniquet was released and hemostasis was achieved.  The thumb had return of brisk capillary refill to its tip.  Wound was then copiously irrigated with normal saline and the skin and subcutaneous tissues were debrided sharply with pickups and scissors.  Care was taken to protect both the radial and ulnar digital nerves.  The skin edges were then approximated and closed with interrupted 4-0 Prolene sutures.  Xeroform, 4 x 4's and a well-padded thumb spica splint were then placed in standard fashion.  Patient was awoken from his anesthesia and extubated in the operating room without a complications.  He was taken to the PACU in stable condition.  Patient tolerated procedure well and there were no complications.   RADIOGRAPHIC INTERPRETATION: 2 views of the right thumb occluding PA and lateral radiographs show near anatomic alignment of the comminuted proximal phalanx fracture.  There are 2 traversing K wires without have any evidence of complication.  ESTIMATED BLOOD LOSS: 10 mL's  TOURNIQUET TIME: Less than an hour  SPECIMENS: None  POSTOPERATIVE PLAN: The patient will be discharged home and seen back  in the office in approximately 10-12 days for wound check, suture  removal, and then be sent to a therapist for wound care, edema control, hand-based thumb spica splint and range of motion exercises  IMPLANTS: 0.045 K wires x2

## 2019-02-27 NOTE — H&P (Signed)
Kristopher Fox is an 58 y.o. male.  HPI: Patient is a 58 year old male who presents today for treatment of his right thumb.  Last week he sustained a laceration and subsequent open fracture to the right thumb proximal phalanx.  He was seen in my clinic where he had some paresthesias to the tip of his thumb as well which was concerning for a digital nerve injury.  He was unable to flex the thumb IP joint in clinic thus the concern was also for a flexor tendon injury.  He states his pain is been well controlled since he was evaluated in clinic.  He has been taking oral antibiotics since then.  Past Medical History:  Diagnosis Date  . Diabetes mellitus without complication (HCC)   . Hypertension   . Proximal phalanx fracture of finger    right thumb    Past Surgical History:  Procedure Laterality Date  . NO PAST SURGERIES      Family History  Problem Relation Age of Onset  . Cancer Other   . Diabetes Other     Social History:  reports that he has never smoked. He has never used smokeless tobacco. He reports that he does not drink alcohol or use drugs.  Allergies: No Known Allergies  Medications: I have reviewed the patient's current medications.  Results for orders placed or performed during the hospital encounter of 02/27/19 (from the past 48 hour(s))  Glucose, capillary     Status: Abnormal   Collection Time: 02/27/19  8:49 AM  Result Value Ref Range   Glucose-Capillary 101 (H) 70 - 99 mg/dL    No results found.  Review of Systems  Musculoskeletal: Positive for joint pain.  All other systems reviewed and are negative.  Blood pressure (!) 165/96, pulse (!) 59, temperature 97.7 F (36.5 C), temperature source Oral, resp. rate 17, height 5\' 11"  (1.803 m), weight 108.4 kg.   Constitutional: General Appearance: healthy-appearing, NAD, and normal body habitus.  Psychiatric: Orientation: oriented to time, place, and person. Mood and Affect: normal mood and affect and  active and alert.  Cardiovascular System: Arterial Pulses Right: radial pulse 2+ and capillary refill test normal. Edema Right: none.  Lungs: Lungs respirations unlabored.  Skin: Right Upper Extremity: normal.  Neurological System: Sensation on the Right: normal ulnar nerve distribution, radial nerve distribution, and median nerve distribution.  Examination of the right upper extremity shows a mildly swollen right thumb. There is no erythema, lymphadenopathy or signs of infection. There is a 2 cm volar laceration on the thumb over the proximal phalanx. There is no wound drainage. He has tenderness to palpation throughout the proximal phalanx of the thumb. He has questionable flexion to the IP joint of the thumb but is significantly limited by pain. His extension is intact. The tip of the thumb is warm and well perfused with brisk capillary refill. He has subjective decreased sensation to the tip of the thumb but his two-point discrimination remains normal to both radial and ulnar aspects of the digit at 5 mm. The remainder of the hand is normal without injury.  Assessment/Plan: Right thumb open proximal phalanx fracture with questionable digital nerve and flexor tendon injury  I had a long discussion with patient once again today.  Will receive forward with operative fixation of his right thumb.  This will include stabilization of his proximal phalanx fracture as well as irrigation debridement and possible repair of digital nerves and flexor tendons as needed.  The risk and benefits  of this were discussed which include but are not limited to infection, bleeding, damage to surrounding structures including blood vessels nerves, pain, stiffness, malunion, nonunion and need for additional procedures.  Informed consent was obtained the patient's right hand was marked with surgical marking pen.  We will plan for discharge home postoperatively.  I will see him back in clinic in approximate 10 days  postop.  Cain SaupeJames J Otha Rickles III 02/27/2019, 9:37 AM

## 2019-02-27 NOTE — Anesthesia Procedure Notes (Signed)
Procedure Name: LMA Insertion Date/Time: 02/27/2019 10:08 AM Performed by: Gar Gibbon, CRNA Pre-anesthesia Checklist: Patient identified, Emergency Drugs available, Suction available and Patient being monitored Patient Re-evaluated:Patient Re-evaluated prior to induction Oxygen Delivery Method: Circle system utilized Preoxygenation: Pre-oxygenation with 100% oxygen Induction Type: IV induction Ventilation: Mask ventilation without difficulty LMA: LMA inserted LMA Size: 4.0 Number of attempts: 1 Airway Equipment and Method: Bite block Placement Confirmation: positive ETCO2 Tube secured with: Tape Dental Injury: Teeth and Oropharynx as per pre-operative assessment

## 2019-02-27 NOTE — Transfer of Care (Signed)
Immediate Anesthesia Transfer of Care Note  Patient: Kristopher Fox  Procedure(s) Performed: Right thumb proximal phalanx fracture reduction and surgical fixation with irrigation and debridement (Right Thumb)  Patient Location: PACU  Anesthesia Type:General  Level of Consciousness: sedated and responds to stimulation  Airway & Oxygen Therapy: Patient Spontanous Breathing and Patient connected to nasal cannula oxygen  Post-op Assessment: Report given to RN and Post -op Vital signs reviewed and stable  Post vital signs: Reviewed and stable  Last Vitals:  Vitals Value Taken Time  BP    Temp    Pulse 65 02/27/2019 11:33 AM  Resp 13 02/27/2019 11:33 AM  SpO2 96 % 02/27/2019 11:33 AM  Vitals shown include unvalidated device data.  Last Pain:  Vitals:   02/27/19 0823  TempSrc: Oral  PainSc: 0-No pain         Complications: No apparent anesthesia complications

## 2019-02-28 ENCOUNTER — Encounter (HOSPITAL_BASED_OUTPATIENT_CLINIC_OR_DEPARTMENT_OTHER): Payer: Self-pay | Admitting: Orthopaedic Surgery

## 2020-03-20 DIAGNOSIS — Z1389 Encounter for screening for other disorder: Secondary | ICD-10-CM | POA: Diagnosis not present

## 2020-03-20 DIAGNOSIS — T50905A Adverse effect of unspecified drugs, medicaments and biological substances, initial encounter: Secondary | ICD-10-CM | POA: Diagnosis not present

## 2020-03-20 DIAGNOSIS — E119 Type 2 diabetes mellitus without complications: Secondary | ICD-10-CM | POA: Diagnosis not present

## 2020-03-20 DIAGNOSIS — E6609 Other obesity due to excess calories: Secondary | ICD-10-CM | POA: Diagnosis not present

## 2020-03-20 DIAGNOSIS — E7849 Other hyperlipidemia: Secondary | ICD-10-CM | POA: Diagnosis not present

## 2020-03-20 DIAGNOSIS — Z125 Encounter for screening for malignant neoplasm of prostate: Secondary | ICD-10-CM | POA: Diagnosis not present

## 2020-03-20 DIAGNOSIS — Z6831 Body mass index (BMI) 31.0-31.9, adult: Secondary | ICD-10-CM | POA: Diagnosis not present

## 2020-07-29 ENCOUNTER — Other Ambulatory Visit (HOSPITAL_COMMUNITY): Payer: Self-pay | Admitting: Family Medicine

## 2020-07-29 ENCOUNTER — Ambulatory Visit (HOSPITAL_COMMUNITY)
Admission: RE | Admit: 2020-07-29 | Discharge: 2020-07-29 | Disposition: A | Discharge status: Home / Self Care | Payer: BC Managed Care – PPO | Source: Ambulatory Visit | Attending: Family Medicine | Admitting: Family Medicine

## 2020-07-29 ENCOUNTER — Other Ambulatory Visit: Payer: Self-pay

## 2020-07-29 DIAGNOSIS — M25461 Effusion, right knee: Secondary | ICD-10-CM | POA: Diagnosis not present

## 2020-07-29 DIAGNOSIS — E6609 Other obesity due to excess calories: Secondary | ICD-10-CM | POA: Diagnosis not present

## 2020-07-29 DIAGNOSIS — M25561 Pain in right knee: Secondary | ICD-10-CM

## 2020-07-29 DIAGNOSIS — Z6831 Body mass index (BMI) 31.0-31.9, adult: Secondary | ICD-10-CM | POA: Diagnosis not present

## 2020-11-23 DIAGNOSIS — Z79899 Other long term (current) drug therapy: Secondary | ICD-10-CM | POA: Diagnosis not present

## 2020-11-23 DIAGNOSIS — R519 Headache, unspecified: Secondary | ICD-10-CM | POA: Diagnosis not present

## 2020-11-23 DIAGNOSIS — B349 Viral infection, unspecified: Secondary | ICD-10-CM | POA: Diagnosis not present

## 2020-11-23 DIAGNOSIS — E119 Type 2 diabetes mellitus without complications: Secondary | ICD-10-CM | POA: Diagnosis not present

## 2020-11-23 DIAGNOSIS — E785 Hyperlipidemia, unspecified: Secondary | ICD-10-CM | POA: Diagnosis not present

## 2020-11-23 DIAGNOSIS — R059 Cough, unspecified: Secondary | ICD-10-CM | POA: Diagnosis not present

## 2020-11-23 DIAGNOSIS — Z7984 Long term (current) use of oral hypoglycemic drugs: Secondary | ICD-10-CM | POA: Diagnosis not present

## 2020-11-23 DIAGNOSIS — R509 Fever, unspecified: Secondary | ICD-10-CM | POA: Diagnosis not present

## 2020-11-23 DIAGNOSIS — Z20822 Contact with and (suspected) exposure to covid-19: Secondary | ICD-10-CM | POA: Diagnosis not present

## 2021-01-15 DIAGNOSIS — Z1331 Encounter for screening for depression: Secondary | ICD-10-CM | POA: Diagnosis not present

## 2021-01-15 DIAGNOSIS — I10 Essential (primary) hypertension: Secondary | ICD-10-CM | POA: Diagnosis not present

## 2021-01-15 DIAGNOSIS — Z6830 Body mass index (BMI) 30.0-30.9, adult: Secondary | ICD-10-CM | POA: Diagnosis not present

## 2021-01-15 DIAGNOSIS — E7849 Other hyperlipidemia: Secondary | ICD-10-CM | POA: Diagnosis not present

## 2021-01-15 DIAGNOSIS — E6609 Other obesity due to excess calories: Secondary | ICD-10-CM | POA: Diagnosis not present

## 2021-01-15 DIAGNOSIS — Z1389 Encounter for screening for other disorder: Secondary | ICD-10-CM | POA: Diagnosis not present

## 2021-01-15 DIAGNOSIS — E118 Type 2 diabetes mellitus with unspecified complications: Secondary | ICD-10-CM | POA: Diagnosis not present

## 2021-01-15 DIAGNOSIS — G5603 Carpal tunnel syndrome, bilateral upper limbs: Secondary | ICD-10-CM | POA: Diagnosis not present

## 2021-03-14 DIAGNOSIS — R200 Anesthesia of skin: Secondary | ICD-10-CM | POA: Diagnosis not present

## 2021-03-14 DIAGNOSIS — E1142 Type 2 diabetes mellitus with diabetic polyneuropathy: Secondary | ICD-10-CM | POA: Diagnosis not present

## 2021-08-27 DIAGNOSIS — R200 Anesthesia of skin: Secondary | ICD-10-CM | POA: Diagnosis not present

## 2021-11-28 ENCOUNTER — Ambulatory Visit (HOSPITAL_COMMUNITY)
Admission: RE | Admit: 2021-11-28 | Discharge: 2021-11-28 | Disposition: A | Discharge status: Home / Self Care | Payer: BC Managed Care – PPO | Source: Ambulatory Visit | Attending: Family Medicine | Admitting: Family Medicine

## 2021-11-28 ENCOUNTER — Other Ambulatory Visit: Payer: Self-pay

## 2021-11-28 ENCOUNTER — Other Ambulatory Visit (HOSPITAL_COMMUNITY): Payer: Self-pay | Admitting: Family Medicine

## 2021-11-28 DIAGNOSIS — Z0000 Encounter for general adult medical examination without abnormal findings: Secondary | ICD-10-CM | POA: Diagnosis not present

## 2021-11-28 DIAGNOSIS — E7849 Other hyperlipidemia: Secondary | ICD-10-CM | POA: Diagnosis not present

## 2021-11-28 DIAGNOSIS — Z6830 Body mass index (BMI) 30.0-30.9, adult: Secondary | ICD-10-CM | POA: Diagnosis not present

## 2021-11-28 DIAGNOSIS — R059 Cough, unspecified: Secondary | ICD-10-CM | POA: Diagnosis not present

## 2021-11-28 DIAGNOSIS — E782 Mixed hyperlipidemia: Secondary | ICD-10-CM | POA: Diagnosis not present

## 2021-11-28 DIAGNOSIS — E118 Type 2 diabetes mellitus with unspecified complications: Secondary | ICD-10-CM | POA: Diagnosis not present

## 2021-11-28 DIAGNOSIS — R0681 Apnea, not elsewhere classified: Secondary | ICD-10-CM | POA: Diagnosis not present

## 2021-11-28 DIAGNOSIS — E6609 Other obesity due to excess calories: Secondary | ICD-10-CM | POA: Diagnosis not present

## 2021-11-28 DIAGNOSIS — G5603 Carpal tunnel syndrome, bilateral upper limbs: Secondary | ICD-10-CM | POA: Diagnosis not present

## 2021-11-28 DIAGNOSIS — I10 Essential (primary) hypertension: Secondary | ICD-10-CM | POA: Diagnosis not present

## 2021-11-28 DIAGNOSIS — R079 Chest pain, unspecified: Secondary | ICD-10-CM | POA: Diagnosis not present

## 2022-12-21 DIAGNOSIS — R0681 Apnea, not elsewhere classified: Secondary | ICD-10-CM | POA: Diagnosis not present

## 2022-12-21 DIAGNOSIS — E119 Type 2 diabetes mellitus without complications: Secondary | ICD-10-CM | POA: Diagnosis not present

## 2022-12-21 DIAGNOSIS — E782 Mixed hyperlipidemia: Secondary | ICD-10-CM | POA: Diagnosis not present

## 2022-12-21 DIAGNOSIS — Z0001 Encounter for general adult medical examination with abnormal findings: Secondary | ICD-10-CM | POA: Diagnosis not present

## 2022-12-21 DIAGNOSIS — Z6831 Body mass index (BMI) 31.0-31.9, adult: Secondary | ICD-10-CM | POA: Diagnosis not present

## 2022-12-21 DIAGNOSIS — E118 Type 2 diabetes mellitus with unspecified complications: Secondary | ICD-10-CM | POA: Diagnosis not present

## 2022-12-21 DIAGNOSIS — E6609 Other obesity due to excess calories: Secondary | ICD-10-CM | POA: Diagnosis not present

## 2022-12-21 DIAGNOSIS — Z1331 Encounter for screening for depression: Secondary | ICD-10-CM | POA: Diagnosis not present

## 2022-12-21 DIAGNOSIS — I10 Essential (primary) hypertension: Secondary | ICD-10-CM | POA: Diagnosis not present

## 2023-10-13 IMAGING — DX DG CHEST 2V
2 series · 2 of 2 positions shown · non-contrast
Comparison: November 23, 2020

CLINICAL DATA: Chest pain.

EXAM:
CHEST - 2 VIEW

[chest pa]
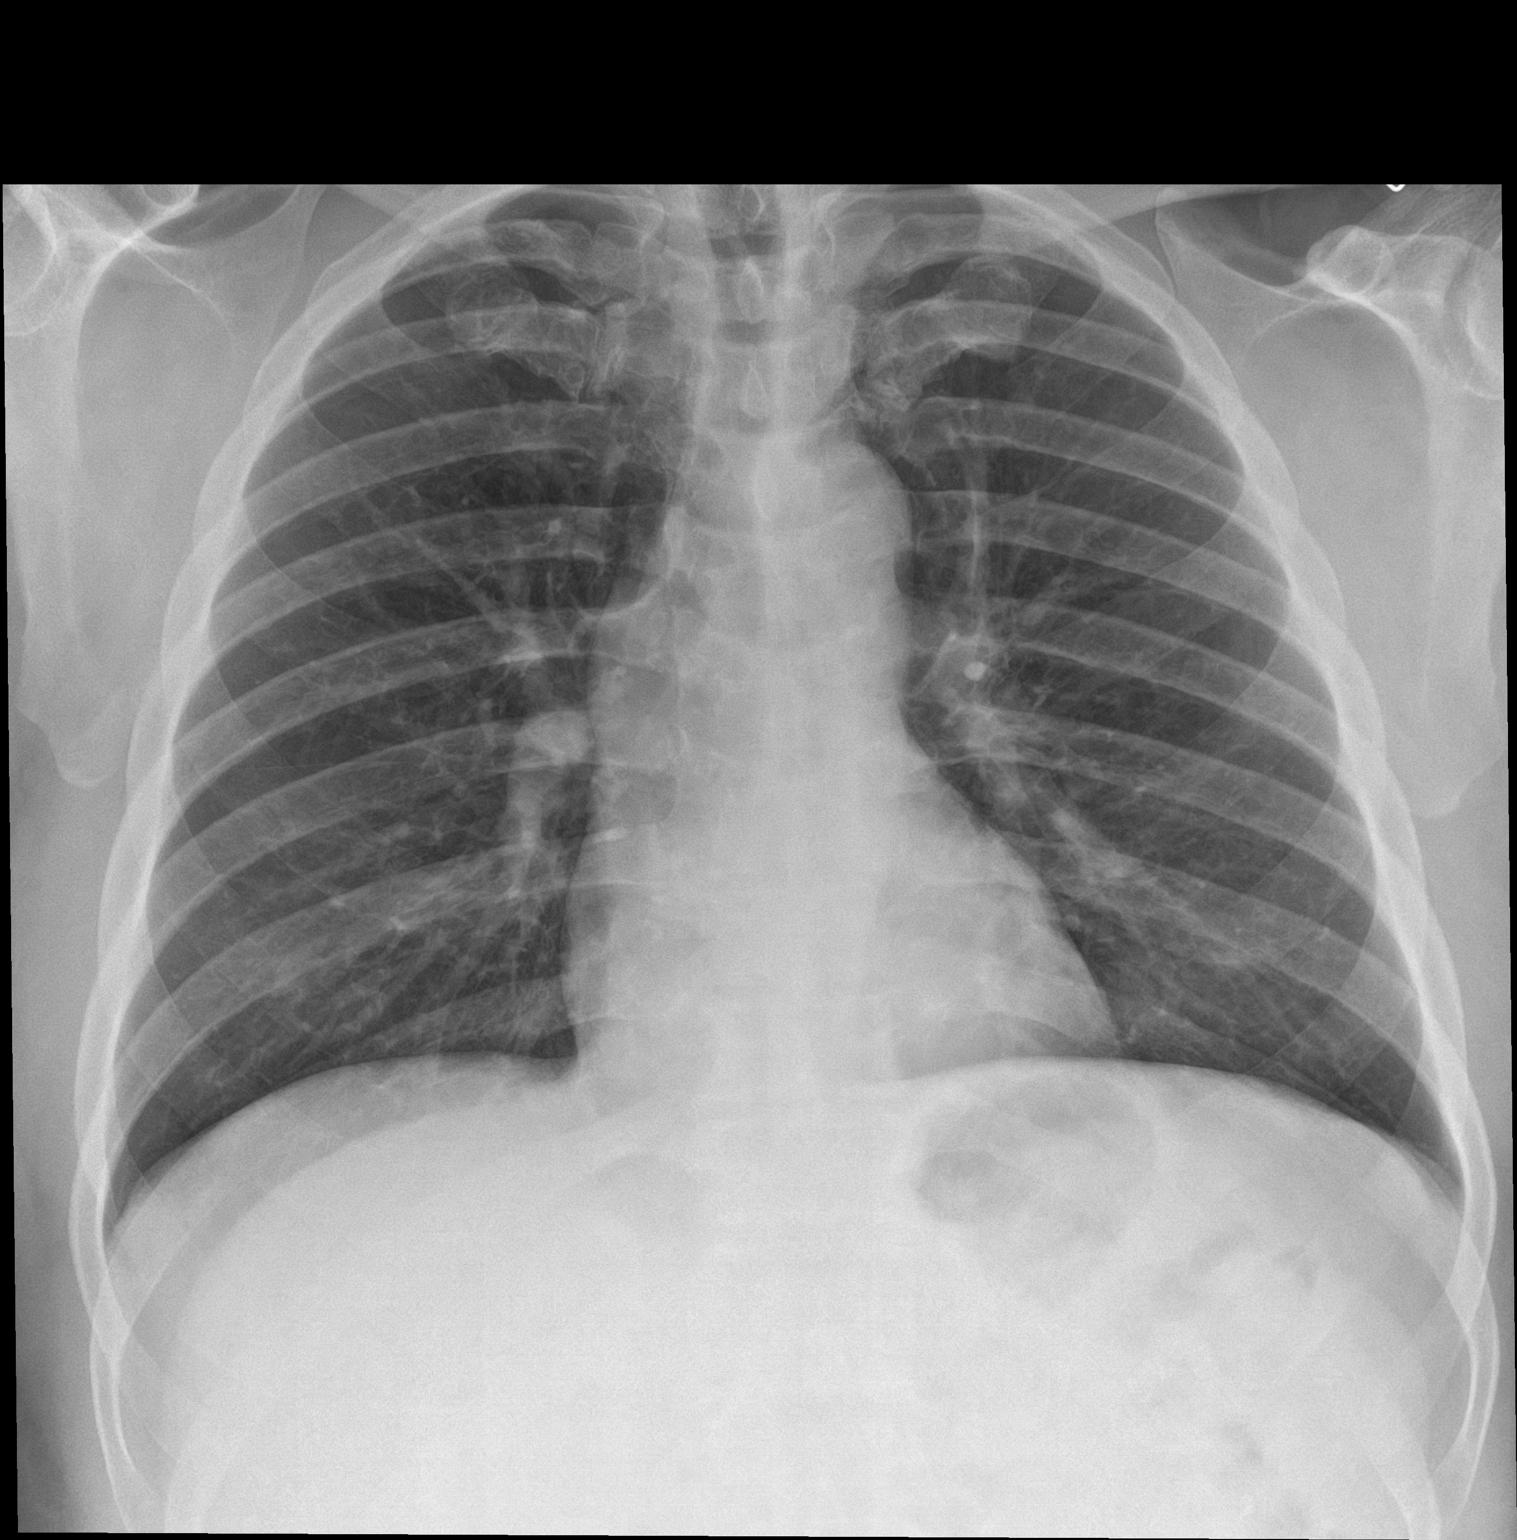

[chest lat]
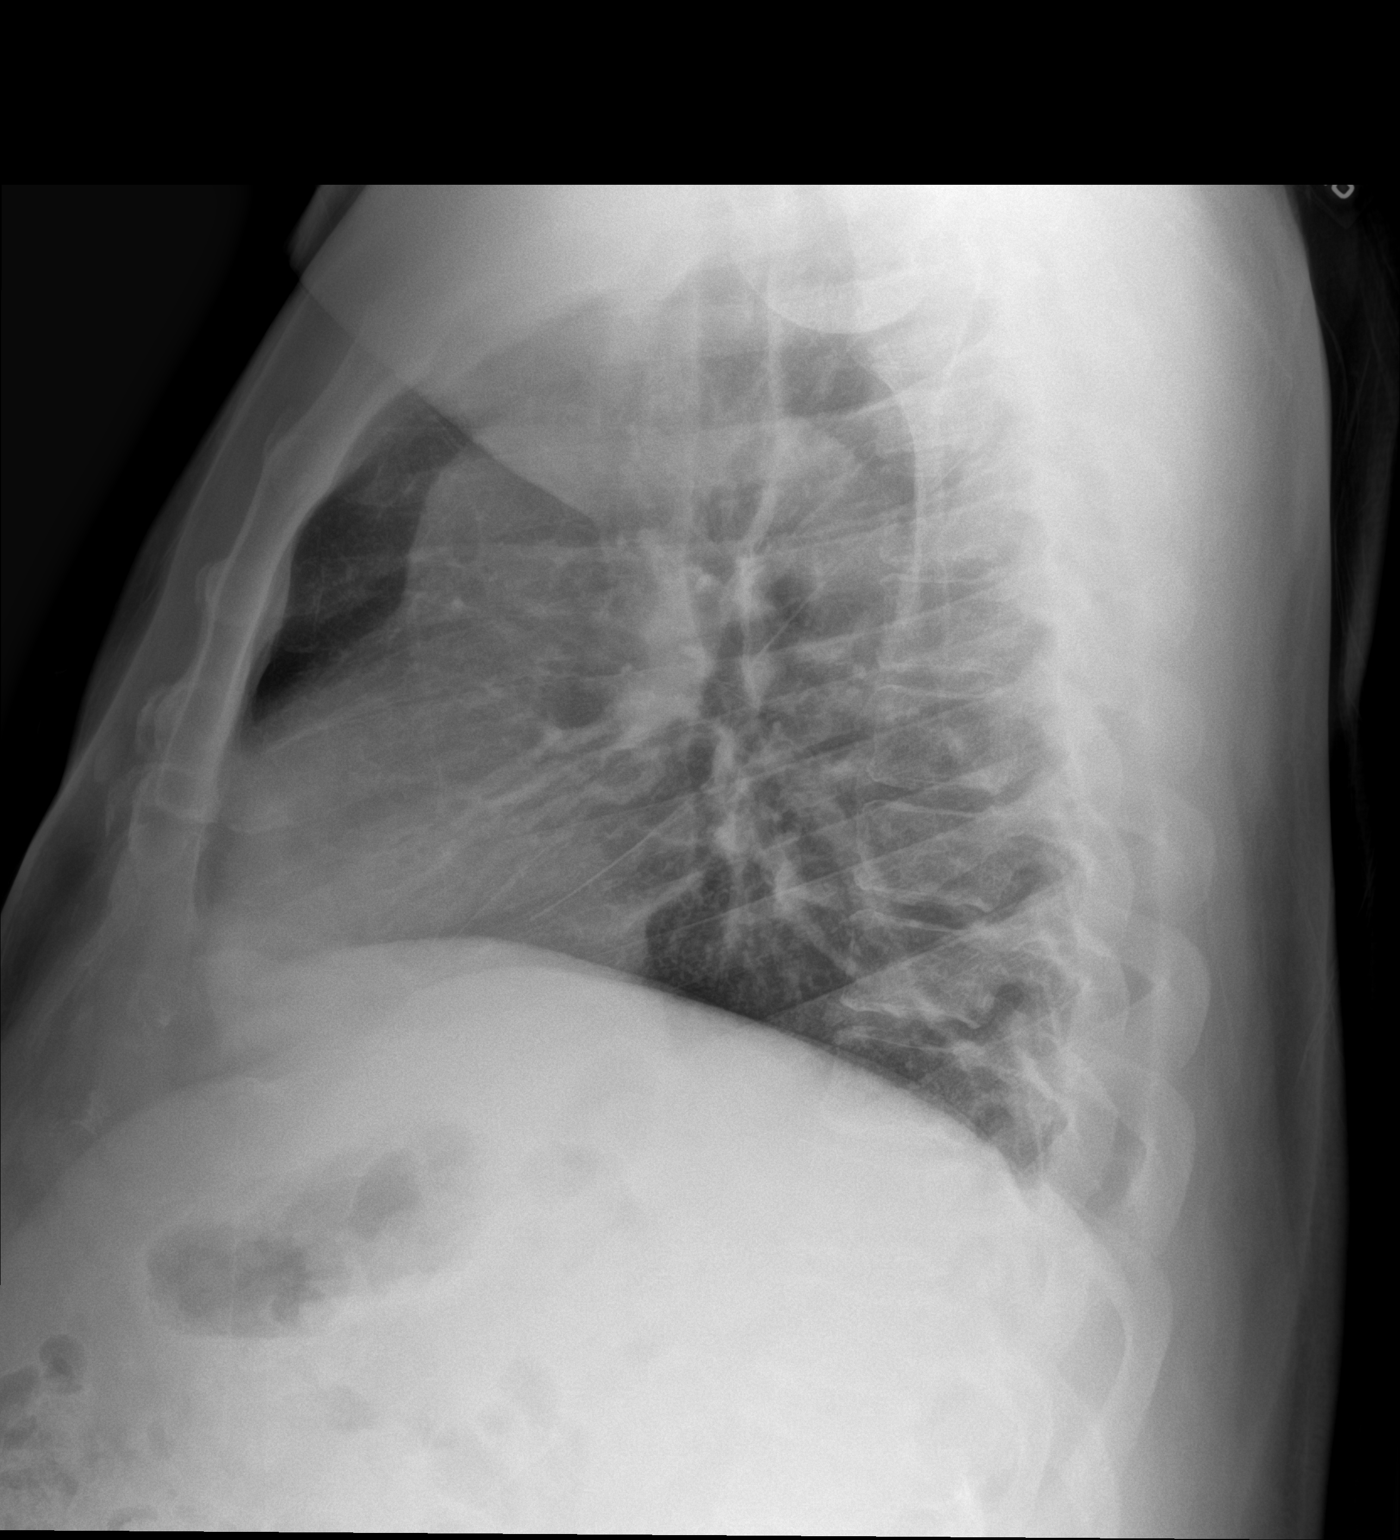

[2 of 2 positions shown; findings below may reference images not displayed]

FINDINGS: The heart size and mediastinal contours are within normal limits.
Both lungs are clear. The visualized skeletal structures are
unremarkable.
IMPRESSION: No active cardiopulmonary disease.
